# Patient Record
Sex: Female | Born: 1945 | Race: White | Hispanic: No | Marital: Married | Smoking: Never smoker
Health system: Southern US, Community
[De-identification: ages and names within clinical notes are randomized; demographics above are authoritative.]

## PROBLEM LIST (undated history)

## (undated) DIAGNOSIS — I10 Essential (primary) hypertension: Secondary | ICD-10-CM

## (undated) HISTORY — PX: ABDOMINAL HYSTERECTOMY: SHX81

---

## 2012-12-14 ENCOUNTER — Emergency Department (HOSPITAL_BASED_OUTPATIENT_CLINIC_OR_DEPARTMENT_OTHER): Payer: Medicare Other

## 2012-12-14 ENCOUNTER — Encounter (HOSPITAL_BASED_OUTPATIENT_CLINIC_OR_DEPARTMENT_OTHER): Payer: Self-pay | Admitting: Emergency Medicine

## 2012-12-14 ENCOUNTER — Emergency Department (HOSPITAL_BASED_OUTPATIENT_CLINIC_OR_DEPARTMENT_OTHER)
Admission: EM | Admit: 2012-12-14 | Discharge: 2012-12-14 | Disposition: A | Discharge status: Home / Self Care | Payer: Medicare Other | Attending: Emergency Medicine | Admitting: Emergency Medicine

## 2012-12-14 DIAGNOSIS — M545 Low back pain, unspecified: Secondary | ICD-10-CM | POA: Insufficient documentation

## 2012-12-14 DIAGNOSIS — Z79899 Other long term (current) drug therapy: Secondary | ICD-10-CM | POA: Insufficient documentation

## 2012-12-14 MED ORDER — OXYCODONE-ACETAMINOPHEN 5-325 MG PO TABS
1.0000 | ORAL_TABLET | Freq: Once | ORAL | Status: AC
  Administered 2012-12-14: 1 via ORAL
  Filled 2012-12-14: qty 1

## 2012-12-14 MED ORDER — OXYCODONE-ACETAMINOPHEN 5-325 MG PO TABS: 1.0000 | ORAL_TABLET | Freq: Four times a day (QID) | ORAL | Status: AC | PRN

## 2012-12-14 MED ORDER — IBUPROFEN 800 MG PO TABS
800.0000 mg | ORAL_TABLET | Freq: Once | ORAL | Status: AC
  Administered 2012-12-14: 800 mg via ORAL
  Filled 2012-12-14: qty 1

## 2012-12-14 MED ORDER — CYCLOBENZAPRINE HCL 5 MG PO TABS: 5.0000 mg | ORAL_TABLET | Freq: Three times a day (TID) | ORAL | Status: AC | PRN

## 2012-12-14 MED ORDER — IBUPROFEN 600 MG PO TABS: 600.0000 mg | ORAL_TABLET | Freq: Four times a day (QID) | ORAL | Status: AC | PRN

## 2012-12-14 MED ORDER — IBUPROFEN 800 MG PO TABS
ORAL_TABLET | ORAL | Status: AC
  Administered 2012-12-14: 800 mg via ORAL
  Filled 2012-12-14: qty 1

## 2012-12-14 NOTE — ED Notes (Signed)
Pt having left sided lower back pain after bending over today.  Pt states pain is localized.  No elimination problems.  No numbness or tingling.

## 2012-12-14 NOTE — ED Provider Notes (Signed)
CSN: [id number]     Arrival date & time 12/14/12  1523 History  This chart was scribed for Ethelda Chick, MD by Dorothey Baseman, ED Scribe. This patient was seen in room MH03/MH03 and the patient's care was started at 4:29 PM.    Chief Complaint  Patient presents with  . Back Pain   Patient is a 67 y.o. female presenting with back pain. The history is provided by the patient. No language interpreter was used.  Back Pain Location:  Lumbar spine Radiates to:  Does not radiate Pain severity:  Moderate Onset quality:  Sudden Timing:  Constant Chronicity:  Recurrent Context: twisting   Relieved by:  Muscle relaxants Associated symptoms: no dysuria and no weakness    HPI Comments: Patricia Kirk is a 67 y.o. female who presents to the Emergency Department complaining of a constant, non-radiating pain to the left, lower back onset this morning after she reports bending over. She reports that she has had similar back pain in the past that typically resolves on its own, but that this episode feels more severe than usual. She reports that she took a Flexeril at home with mild, temporary relief. She denies dysuria, leg weakness. She denies any allergies to medications. Patient denies any other pertinent medical history.   No past medical history on file. Past Surgical History  Procedure Laterality Date  . Abdominal hysterectomy    . Cesarean section     No family history on file. History  Substance Use Topics  . Smoking status: Never Smoker   . Smokeless tobacco: Not on file  . Alcohol Use: Not on file   OB History   Grav Para Term Preterm Abortions TAB SAB Ect Mult Living                 Review of Systems  Genitourinary: Negative for dysuria.  Musculoskeletal: Positive for back pain.  Neurological: Negative for weakness.    Allergies  Review of patient's allergies indicates no known allergies.  Home Medications   Current Outpatient Rx  Name  Route  Sig  Dispense  Refill  .  cyclobenzaprine (FLEXERIL) 10 MG tablet   Oral   Take 10 mg by mouth 3 (three) times daily as needed for muscle spasms.         . cyclobenzaprine (FLEXERIL) 5 MG tablet   Oral   Take 1 tablet (5 mg total) by mouth 3 (three) times daily as needed for muscle spasms.   20 tablet   0   . ibuprofen (ADVIL,MOTRIN) 600 MG tablet   Oral   Take 1 tablet (600 mg total) by mouth every 6 (six) hours as needed for pain.   30 tablet   0   . oxyCODONE-acetaminophen (PERCOCET/ROXICET) 5-325 MG per tablet   Oral   Take 1-2 tablets by mouth every 6 (six) hours as needed for pain.   15 tablet   0   . valACYclovir (VALTREX) 1000 MG tablet   Oral   Take 1,000 mg by mouth 2 (two) times daily.          Triage Vitals: BP 141/64  Pulse 76  Temp(Src) 98 F (36.7 C) (Oral)  Resp 16  Ht 5\' 7"  (1.702 m)  Wt 127 lb (57.607 kg)  BMI 19.89 kg/m2  SpO2 99%  Physical Exam  Nursing note and vitals reviewed. Constitutional: She is oriented to person, place, and time. She appears well-developed and well-nourished. No distress.  HENT:  Head: Normocephalic and  atraumatic.  Eyes: Conjunctivae are normal.  Neck: Normal range of motion. Neck supple.  Cardiovascular: Normal rate, regular rhythm and normal heart sounds.   Pulmonary/Chest: Effort normal and breath sounds normal. No respiratory distress.  Abdominal: She exhibits no distension.  Musculoskeletal: Normal range of motion.  Tenderness to palpation to the left, lumbar paraspinal muscles with some tenderness to the lumbar midline.   Neurological: She is alert and oriented to person, place, and time.  Skin: Skin is warm and dry.  Psychiatric: She has a normal mood and affect. Her behavior is normal.  Neuro- strength 5/5 in extremities x 4, sensation intact, gait normal  ED Course  Procedures (including critical care time)  Medications  ibuprofen (ADVIL,MOTRIN) tablet 800 mg (800 mg Oral Given 12/14/12 1647)  oxyCODONE-acetaminophen  (PERCOCET/ROXICET) 5-325 MG per tablet 1 tablet (1 tablet Oral Given 12/14/12 1647)    DIAGNOSTIC STUDIES: Oxygen Saturation is 99% on room air, normal by my interpretation.    COORDINATION OF CARE: 4:32 PM- Discussed that symptoms are likely due a muscle spasm, but will order an x-ray of the L spine. Will order pain ibuprofen and Percocet to manage symptoms. Advised patient to follow up if there are any new or worsening symptoms, especially leg swelling or weakness and urinary incontinence. Discussed treatment plan with patient at bedside and patient verbalized agreement.     Labs Review Labs Reviewed - No data to display  Imaging Review Dg Lumbar Spine Complete  12/14/2012   CLINICAL DATA:  Left-sided low back pain.  EXAM: LUMBAR SPINE - COMPLETE 4+ VIEW  COMPARISON:  None.  FINDINGS: There is no evidence of lumbar spine fracture. Alignment is normal. Intervertebral disc spaces are maintained. Mild facet DJD noted bilaterally at L4-5 and L5-S1.  IMPRESSION: No acute findings.  Mild lower lumbar facet DJD.   Electronically Signed   By: Myles Rosenthal M.D.   On: 12/14/2012 17:36    EKG Interpretation   None       MDM   1. Low back pain    Pt presenting with c/o left lower back pain.  Pain began after bending over this morning.  Xray reassuring, images reviewed and interpreted by me as well.  Pt treated with ibuprofen, flexeril, pain medication.  No signs or symptoms of cauda equina or epidural abscess.  Discharged with strict return precautions.  Pt agreeable with plan.  I personally performed the services described in this documentation, which was scribed in my presence. The recorded information has been reviewed and is accurate.     Ethelda Chick, MD 12/14/12 [phone]

## 2018-06-15 ENCOUNTER — Encounter (HOSPITAL_BASED_OUTPATIENT_CLINIC_OR_DEPARTMENT_OTHER): Payer: Self-pay

## 2018-06-15 ENCOUNTER — Emergency Department (HOSPITAL_BASED_OUTPATIENT_CLINIC_OR_DEPARTMENT_OTHER)
Admission: EM | Admit: 2018-06-15 | Discharge: 2018-06-15 | Disposition: A | Discharge status: Home / Self Care | Payer: Medicare Other | Attending: Emergency Medicine | Admitting: Emergency Medicine

## 2018-06-15 ENCOUNTER — Emergency Department (HOSPITAL_BASED_OUTPATIENT_CLINIC_OR_DEPARTMENT_OTHER): Payer: Medicare Other

## 2018-06-15 ENCOUNTER — Other Ambulatory Visit: Payer: Self-pay

## 2018-06-15 DIAGNOSIS — M70871 Other soft tissue disorders related to use, overuse and pressure, right ankle and foot: Secondary | ICD-10-CM | POA: Diagnosis not present

## 2018-06-15 DIAGNOSIS — Z79899 Other long term (current) drug therapy: Secondary | ICD-10-CM | POA: Insufficient documentation

## 2018-06-15 DIAGNOSIS — Y9389 Activity, other specified: Secondary | ICD-10-CM | POA: Diagnosis not present

## 2018-06-15 DIAGNOSIS — M7750 Other enthesopathy of unspecified foot: Secondary | ICD-10-CM

## 2018-06-15 DIAGNOSIS — M79671 Pain in right foot: Secondary | ICD-10-CM | POA: Diagnosis present

## 2018-06-15 NOTE — ED Provider Notes (Signed)
[facility] EMERGENCY DEPARTMENT Provider Note   CSN: [id number] Arrival date & time: 06/15/18  0944    History   Chief Complaint Chief Complaint  Patient presents with  . Foot Pain    HPI Patricia Kirk is a 73 y.o. female.  She has no significant past medical history.  She said she injured her right ankle about a week ago when she was crawling in a tight space and had an awkward position.  Since then she has has persistent pain.  It is moderate and sharp and increased with movement and improved with rest.  No other injuries or complaints.     The history is provided by the patient.  Ankle Pain  Location:  Ankle Time since incident:  1 week Injury: yes   Mechanism of injury comment:  Twisted Ankle location:  R ankle Pain details:    Quality:  Shooting   Radiates to:  Does not radiate   Severity:  Moderate   Onset quality:  Sudden   Duration:  1 week   Timing:  Intermittent   Progression:  Unchanged Chronicity:  New Dislocation: no   Foreign body present:  No foreign bodies Relieved by:  Rest Worsened by:  Bearing weight and activity Associated symptoms: no fever, no numbness, no swelling and no tingling     History reviewed. No pertinent past medical history.  There are no active problems to display for this patient.   Past Surgical History:  Procedure Laterality Date  . ABDOMINAL HYSTERECTOMY    . CESAREAN SECTION       OB History   No obstetric history on file.      Home Medications    Prior to Admission medications   Medication Sig Start Date End Date Taking? Authorizing Provider  cyclobenzaprine (FLEXERIL) 10 MG tablet Take 10 mg by mouth 3 (three) times daily as needed for muscle spasms.    [provider]  cyclobenzaprine (FLEXERIL) 5 MG tablet Take 1 tablet (5 mg total) by mouth 3 (three) times daily as needed for muscle spasms. 12/14/12   Mabe, Latanya MaudlinMartha L, MD  ibuprofen (ADVIL,MOTRIN) 600 MG tablet Take 1 tablet (600 mg total)  by mouth every 6 (six) hours as needed for pain. 12/14/12   Mabe, Latanya MaudlinMartha L, MD  oxyCODONE-acetaminophen (PERCOCET/ROXICET) 5-325 MG per tablet Take 1-2 tablets by mouth every 6 (six) hours as needed for pain. 12/14/12   Mabe, Latanya MaudlinMartha L, MD  valACYclovir (VALTREX) 1000 MG tablet Take 1,000 mg by mouth 2 (two) times daily.    [provider]    Family History History reviewed. No pertinent family history.  Social History Social History   Tobacco Use  . Smoking status: Never Smoker  Substance Use Topics  . Alcohol use: Not on file  . Drug use: Not on file     Allergies   Patient has no known allergies.   Review of Systems Review of Systems  Constitutional: Negative for fever.  Skin: Negative for wound.  Neurological: Negative for numbness.     Physical Exam Updated Vital Signs BP (!) 145/77 (BP Location: Right Arm)   Pulse 78   Temp 98 F (36.7 C) (Oral)   Resp 18   Ht 5\' 6"  (1.676 m)   Wt 54.4 kg   SpO2 99%   BMI 19.37 kg/m   Physical Exam Constitutional:      Appearance: She is well-developed.  HENT:     Head: Normocephalic and atraumatic.  Eyes:  Conjunctiva/sclera: Conjunctivae normal.  Neck:     Musculoskeletal: Neck supple.  Musculoskeletal: Normal range of motion.        General: Tenderness present. No deformity.     Right lower leg: No edema.     Left lower leg: No edema.     Comments: Right foot and ankle full range of motion without any limitation.  Ankle ligaments nontender.  No malleolar tenderness.  No fifth metatarsal tenderness.  No proximal fibular tenderness.  She has point tenderness above the joint line about 3 cm on the medial tibia.  No overlying erythema or skin changes.  Achilles tendon intact and nontender.  Skin:    General: Skin is warm and dry.     Capillary Refill: Capillary refill takes less than 2 seconds.  Neurological:     General: No focal deficit present.     Mental Status: She is alert.     GCS: GCS eye subscore  is 4. GCS verbal subscore is 5. GCS motor subscore is 6.     Sensory: No sensory deficit.     Motor: No weakness.      ED Treatments / Results  Labs (all labs ordered are listed, but only abnormal results are displayed) Labs Reviewed - No data to display  EKG None  Radiology Dg Ankle Complete Right  Result Date: 06/15/2018 CLINICAL DATA:  Medial ankle pain after twisting injury. EXAM: RIGHT ANKLE - COMPLETE 3+ VIEW COMPARISON:  None. FINDINGS: No acute fracture or dislocation. The ankle mortise is symmetric. The talar dome is intact. No tibiotalar joint effusion. Joint spaces are preserved. Osteopenia. Tiny plantar enthesophyte. Soft tissues are unremarkable. IMPRESSION: Negative. Electronically Signed   By: Obie Dredge M.D.   On: 06/15/2018 10:17    Procedures Procedures (including critical care time)  Medications Ordered in ED Medications - No data to display   Initial Impression / Assessment and Plan / ED Course  I have reviewed the triage vital signs and the nursing notes.  Pertinent labs & imaging results that were available during my care of the patient were reviewed by me and considered in my medical decision making (see chart for details).  Clinical Course as of Jun 15 1026  Sun Jun 15, 2018  1025 Differential diagnosis includes fracture, sprain, dislocation, tendon injury, muscle tear   [MB]    Clinical Course User Index [MB] Terrilee Files, MD        Final Clinical Impressions(s) / ED Diagnoses   Final diagnoses:  Tendonitis of ankle    ED Discharge Orders    None       Terrilee Files, MD 06/15/18 1029

## 2018-06-15 NOTE — ED Triage Notes (Signed)
Pt states may have twisted right foot earlier in the week, pain since then.  No visible swelling or deformity. Able to bear weight.  Pain increased with walking.

## 2018-06-15 NOTE — Discharge Instructions (Addendum)
You were seen in the emergency department for ankle pain after an injury.  Your x-rays did not show any obvious fractures.  Most of your tenderness is above the ankle joint and is likely related to some tendon injury and inflammation.  You should use ice to this area and elevate and rest when able.

## 2020-01-16 ENCOUNTER — Encounter (HOSPITAL_BASED_OUTPATIENT_CLINIC_OR_DEPARTMENT_OTHER): Payer: Self-pay | Admitting: Emergency Medicine

## 2020-01-16 ENCOUNTER — Other Ambulatory Visit: Payer: Self-pay

## 2020-01-16 ENCOUNTER — Emergency Department (HOSPITAL_BASED_OUTPATIENT_CLINIC_OR_DEPARTMENT_OTHER)
Admission: EM | Admit: 2020-01-16 | Discharge: 2020-01-16 | Disposition: A | Discharge status: Home / Self Care | Payer: Medicare PPO | Attending: Emergency Medicine | Admitting: Emergency Medicine

## 2020-01-16 ENCOUNTER — Emergency Department (HOSPITAL_BASED_OUTPATIENT_CLINIC_OR_DEPARTMENT_OTHER): Payer: Medicare PPO

## 2020-01-16 DIAGNOSIS — S96911A Strain of unspecified muscle and tendon at ankle and foot level, right foot, initial encounter: Secondary | ICD-10-CM | POA: Diagnosis not present

## 2020-01-16 DIAGNOSIS — I10 Essential (primary) hypertension: Secondary | ICD-10-CM | POA: Diagnosis not present

## 2020-01-16 DIAGNOSIS — X58XXXA Exposure to other specified factors, initial encounter: Secondary | ICD-10-CM | POA: Insufficient documentation

## 2020-01-16 DIAGNOSIS — S99921A Unspecified injury of right foot, initial encounter: Secondary | ICD-10-CM | POA: Diagnosis present

## 2020-01-16 DIAGNOSIS — R52 Pain, unspecified: Secondary | ICD-10-CM

## 2020-01-16 HISTORY — DX: Essential (primary) hypertension: I10

## 2020-01-16 MED ORDER — KETOROLAC TROMETHAMINE 30 MG/ML IJ SOLN
30.0000 mg | Freq: Once | INTRAMUSCULAR | Status: AC
  Administered 2020-01-16: 30 mg via INTRAMUSCULAR
  Filled 2020-01-16: qty 1

## 2020-01-16 MED ORDER — HYDROCODONE-ACETAMINOPHEN 5-325 MG PO TABS: 1.0000 | ORAL_TABLET | ORAL | 0 refills | Status: AC | PRN

## 2020-01-16 MED ORDER — DEXAMETHASONE SODIUM PHOSPHATE 10 MG/ML IJ SOLN
10.0000 mg | Freq: Once | INTRAMUSCULAR | Status: AC
  Administered 2020-01-16: 10 mg via INTRAMUSCULAR
  Filled 2020-01-16: qty 1

## 2020-01-16 MED ORDER — PREDNISONE 10 MG (21) PO TBPK: ORAL_TABLET | ORAL | 0 refills | Status: AC

## 2020-01-16 NOTE — ED Provider Notes (Signed)
[facility] EMERGENCY DEPARTMENT Provider Note   CSN: [id number] Arrival date & time: 01/16/20  1754     History Chief Complaint  Patient presents with  . Foot Pain    Patricia Kirk is a 74 y.o. female.  Pt presents to the ED today with right foot pain.  Pt said said she was wrapping gifts and her foot started hurting.  She does not recall any trauma.  Pt said it really hurts to put weight on it.        Past Medical History:  Diagnosis Date  . Hypertension     There are no problems to display for this patient.   Past Surgical History:  Procedure Laterality Date  . ABDOMINAL HYSTERECTOMY    . CESAREAN SECTION       OB History   No obstetric history on file.     No family history on file.  Social History   Tobacco Use  . Smoking status: Never Smoker  . Smokeless tobacco: Never Used  Substance Use Topics  . Alcohol use: Never  . Drug use: Never    Home Medications Prior to Admission medications   Medication Sig Start Date End Date Taking? Authorizing Provider  cyclobenzaprine (FLEXERIL) 10 MG tablet Take 10 mg by mouth 3 (three) times daily as needed for muscle spasms.    [provider]  cyclobenzaprine (FLEXERIL) 5 MG tablet Take 1 tablet (5 mg total) by mouth 3 (three) times daily as needed for muscle spasms. 12/14/12   Mabe, Latanya Maudlin, MD  HYDROcodone-acetaminophen (NORCO/VICODIN) 5-325 MG tablet Take 1 tablet by mouth every 4 (four) hours as needed. 01/16/20   Jacalyn Lefevre, MD  ibuprofen (ADVIL,MOTRIN) 600 MG tablet Take 1 tablet (600 mg total) by mouth every 6 (six) hours as needed for pain. 12/14/12   Mabe, Latanya Maudlin, MD  oxyCODONE-acetaminophen (PERCOCET/ROXICET) 5-325 MG per tablet Take 1-2 tablets by mouth every 6 (six) hours as needed for pain. 12/14/12   Mabe, Latanya Maudlin, MD  predniSONE (STERAPRED UNI-PAK 21 TAB) 10 MG (21) TBPK tablet Take 6 tabs for 2 days, then 5 for 2 days, then 4 for 2 days, then 3 for 2 days, 2 for 2 days,  then 1 for 2 days 01/16/20   Jacalyn Lefevre, MD  valACYclovir (VALTREX) 1000 MG tablet Take 1,000 mg by mouth 2 (two) times daily.    [provider]    Allergies    Patient has no known allergies.  Review of Systems   Review of Systems  Musculoskeletal:       Right foot pain  All other systems reviewed and are negative.   Physical Exam Updated Vital Signs BP (!) 176/89   Pulse 85   Temp 98.1 F (36.7 C) (Oral)   Resp 16   Ht 5\' 7"  (1.702 m)   Wt 56.7 kg   SpO2 98%   BMI 19.58 kg/m   Physical Exam Vitals and nursing note reviewed.  Constitutional:      Appearance: Normal appearance.  HENT:     Head: Normocephalic and atraumatic.     Right Ear: External ear normal.     Left Ear: External ear normal.     Nose: Nose normal.     Mouth/Throat:     Mouth: Mucous membranes are moist.     Pharynx: Oropharynx is clear.  Eyes:     Extraocular Movements: Extraocular movements intact.     Conjunctiva/sclera: Conjunctivae normal.     Pupils:  Pupils are equal, round, and reactive to light.  Cardiovascular:     Rate and Rhythm: Normal rate and regular rhythm.     Pulses: Normal pulses.     Heart sounds: Normal heart sounds.  Pulmonary:     Effort: Pulmonary effort is normal.     Breath sounds: Normal breath sounds.  Abdominal:     General: Abdomen is flat. Bowel sounds are normal.     Palpations: Abdomen is soft.  Musculoskeletal:     Cervical back: Normal range of motion and neck supple.     Comments: Right foot slightly red and swollen.  No calf swelling.  Skin:    General: Skin is warm.     Capillary Refill: Capillary refill takes less than 2 seconds.  Neurological:     General: No focal deficit present.     Mental Status: She is alert and oriented to person, place, and time.  Psychiatric:        Mood and Affect: Mood normal.        Behavior: Behavior normal.     ED Results / Procedures / Treatments   Labs (all labs ordered are listed, but only  abnormal results are displayed) Labs Reviewed - No data to display  EKG None  Radiology DG Foot Complete Right  Result Date: 01/16/2020 CLINICAL DATA:  Patient was putting gifts under the tree today and when she got up and started to walk she felt pain in her Rt foot near lateral malleolus/lateral tarsal area that has increased since then EXAM: RIGHT FOOT COMPLETE - 3+ VIEW COMPARISON:  None. FINDINGS: No fracture.  No bone lesion. 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 Remaining joints normally spaced and aligned. Small dorsal and plantar calcaneal spurs. Soft tissues are unremarkable. IMPRESSION: 1. No fracture or dislocation. Electronically Signed   By: Amie Portland M.D.   On: 01/16/2020 18:50    Procedures Procedures (including critical care time)  Medications Ordered in ED Medications  dexamethasone (DECADRON) injection 10 mg (10 mg Intramuscular Given 01/16/20 2245)  ketorolac (TORADOL) 30 MG/ML injection 30 mg (30 mg Intramuscular Given 01/16/20 2244)    ED Course  I have reviewed the triage vital signs and the nursing notes.  Pertinent labs & imaging results that were available during my care of the patient were reviewed by me and considered in my medical decision making (see chart for details).    MDM Rules/Calculators/A&P                          Xray shows no fx.  She does have some arthritis.  She is given decadron and toradol and a post-op shoe.  She is to f/u with ortho.  Return if worse. Final Clinical Impression(s) / ED Diagnoses Final diagnoses:  Pain  Strain of right foot, initial encounter    Rx / DC Orders ED Discharge Orders         Ordered    predniSONE (STERAPRED UNI-PAK 21 TAB) 10 MG (21) TBPK tablet        01/16/20 2305    HYDROcodone-acetaminophen (NORCO/VICODIN) 5-325 MG tablet  Every 4 hours PRN        01/16/20 2305           Jacalyn Lefevre, MD 01/16/20 2306

## 2020-01-16 NOTE — ED Triage Notes (Signed)
Reports pain in right foot after she had been wrapping gifts and placing them under the tree.  Unsure of any injury.

## 2020-07-16 IMAGING — DX RIGHT ANKLE - COMPLETE 3+ VIEW
3 series · 3 of 3 positions shown · non-contrast
Comparison: None.

CLINICAL DATA: Medial ankle pain after twisting injury.

EXAM:
RIGHT ANKLE - COMPLETE 3+ VIEW

[ankle ap]
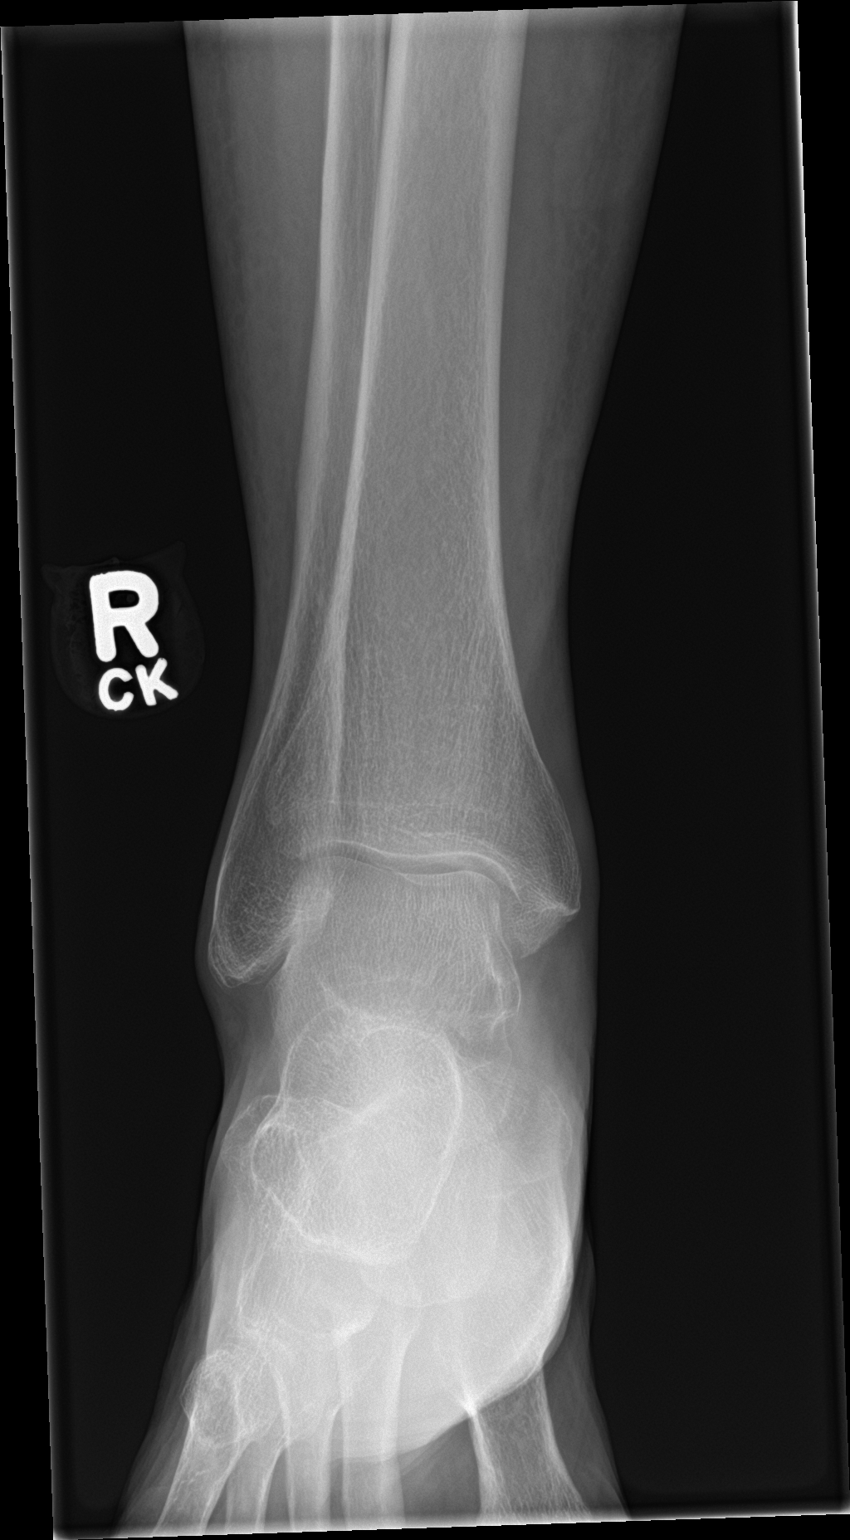

[ankle obl]
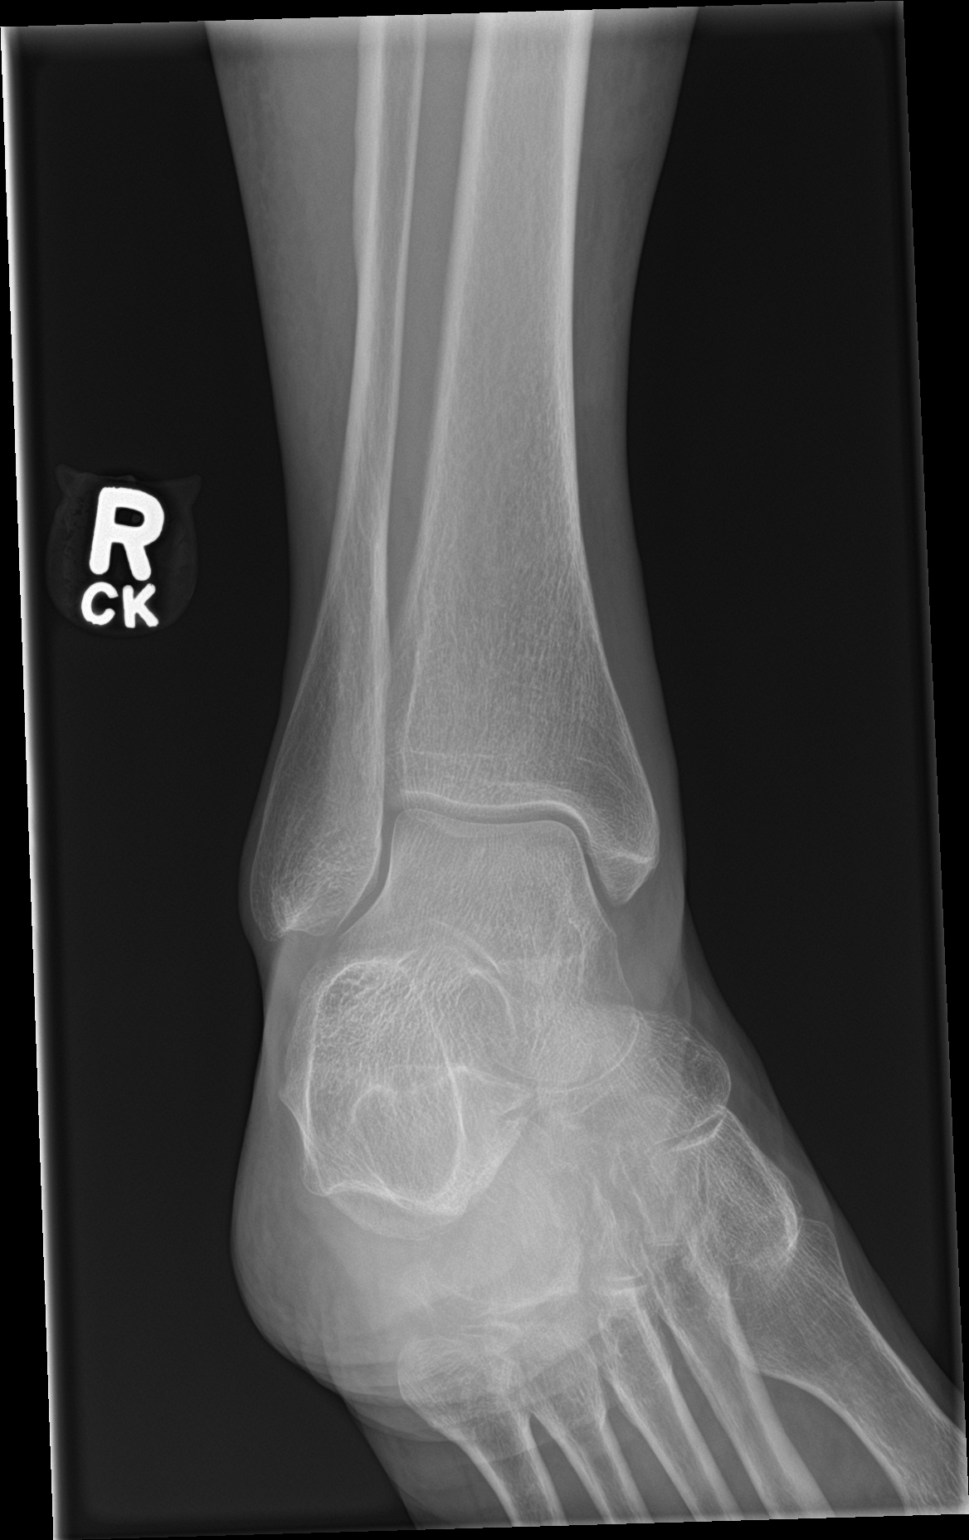

[ankle lat]
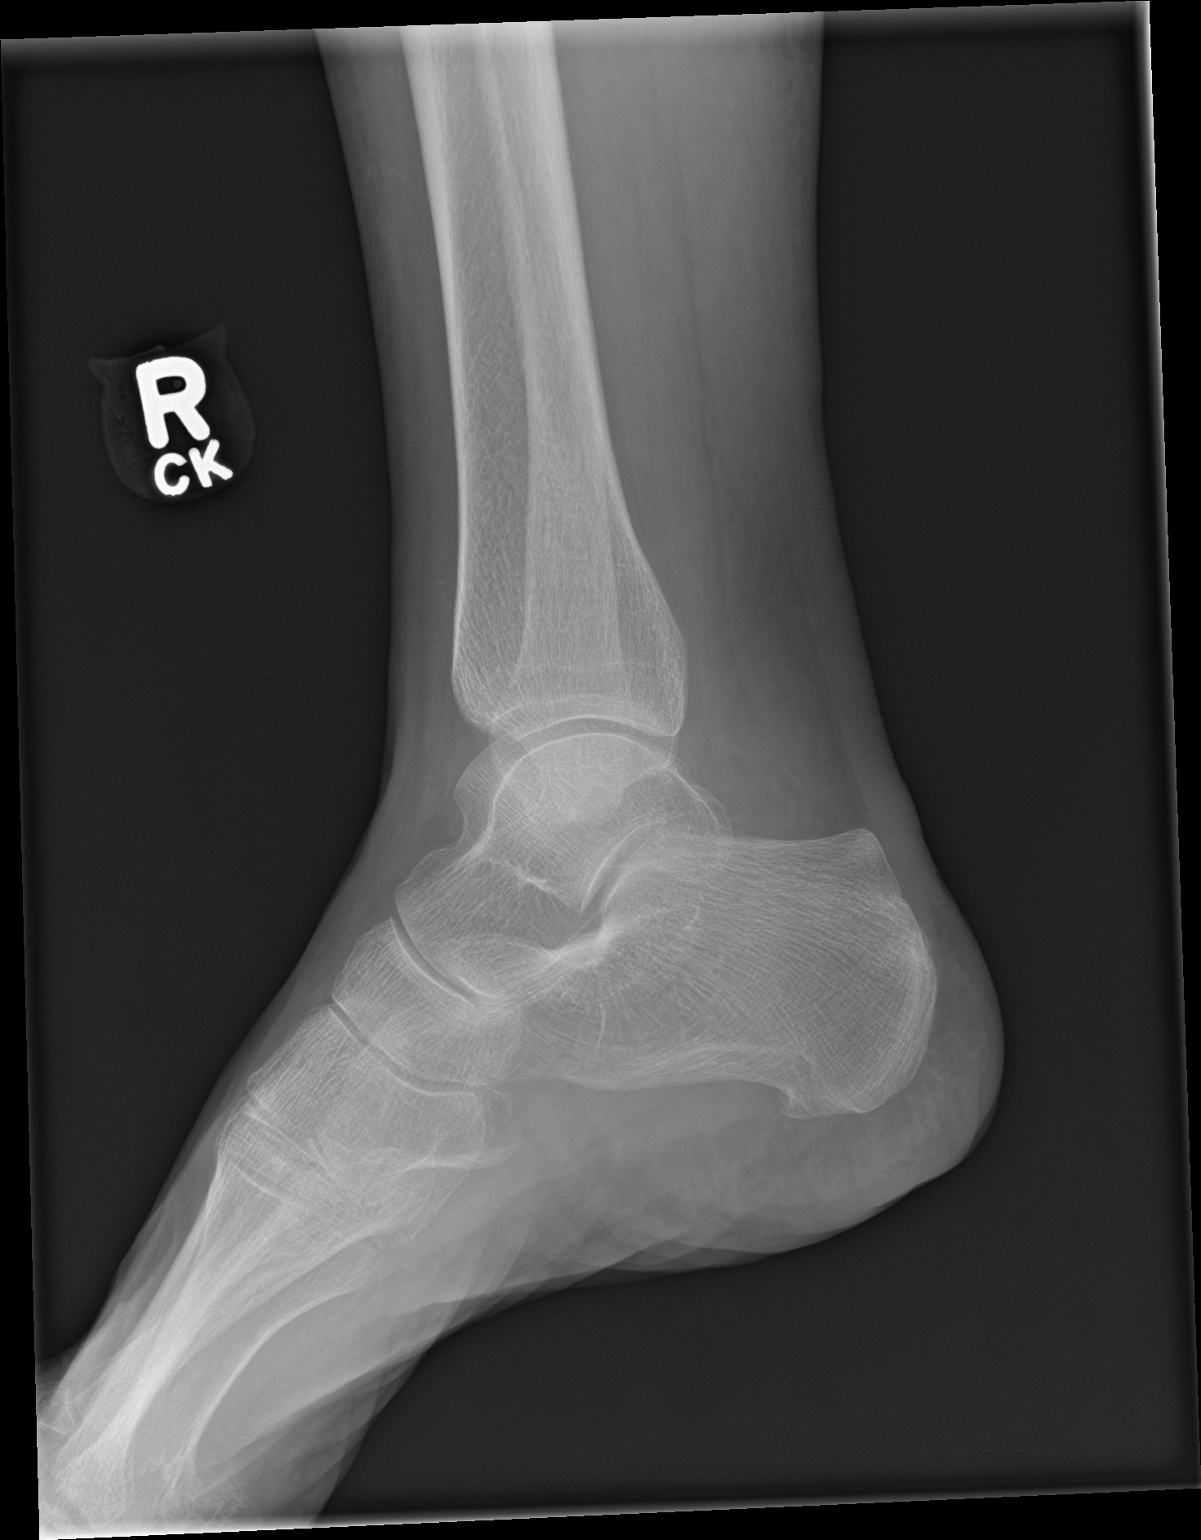

[3 of 3 positions shown; findings below may reference images not displayed]

FINDINGS: No acute fracture or dislocation. The ankle mortise is symmetric.
The talar dome is intact. No tibiotalar joint effusion. Joint spaces
are preserved. Osteopenia. Tiny plantar enthesophyte. Soft tissues
are unremarkable.
IMPRESSION: Negative.
